# Patient Record
Sex: Female | Born: 1990 | Race: White | Hispanic: No | Marital: Single | Smoking: Never smoker
Health system: Southern US, Community
[De-identification: ages and names within clinical notes are randomized; demographics above are authoritative.]

## PROBLEM LIST (undated history)

## (undated) DIAGNOSIS — J45909 Unspecified asthma, uncomplicated: Secondary | ICD-10-CM

## (undated) DIAGNOSIS — G43909 Migraine, unspecified, not intractable, without status migrainosus: Secondary | ICD-10-CM

## (undated) HISTORY — PX: WISDOM TOOTH EXTRACTION: SHX21

## (undated) HISTORY — PX: TYMPANOSTOMY TUBE PLACEMENT: SHX32

## (undated) HISTORY — PX: INCISION AND DRAINAGE: SHX5863

---

## 2013-08-31 ENCOUNTER — Emergency Department (HOSPITAL_COMMUNITY)
Admission: EM | Admit: 2013-08-31 | Discharge: 2013-08-31 | Disposition: A | Discharge status: Home / Self Care | Payer: BC Managed Care – PPO | Attending: Emergency Medicine | Admitting: Emergency Medicine

## 2013-08-31 ENCOUNTER — Encounter (HOSPITAL_COMMUNITY): Payer: Self-pay | Admitting: Emergency Medicine

## 2013-08-31 DIAGNOSIS — Z8679 Personal history of other diseases of the circulatory system: Secondary | ICD-10-CM | POA: Insufficient documentation

## 2013-08-31 DIAGNOSIS — E860 Dehydration: Secondary | ICD-10-CM | POA: Insufficient documentation

## 2013-08-31 DIAGNOSIS — Z880 Allergy status to penicillin: Secondary | ICD-10-CM | POA: Insufficient documentation

## 2013-08-31 DIAGNOSIS — R000 Tachycardia, unspecified: Secondary | ICD-10-CM | POA: Insufficient documentation

## 2013-08-31 DIAGNOSIS — R630 Anorexia: Secondary | ICD-10-CM | POA: Insufficient documentation

## 2013-08-31 DIAGNOSIS — K5289 Other specified noninfective gastroenteritis and colitis: Secondary | ICD-10-CM | POA: Insufficient documentation

## 2013-08-31 DIAGNOSIS — Z79899 Other long term (current) drug therapy: Secondary | ICD-10-CM | POA: Insufficient documentation

## 2013-08-31 DIAGNOSIS — K529 Noninfective gastroenteritis and colitis, unspecified: Secondary | ICD-10-CM

## 2013-08-31 DIAGNOSIS — J45909 Unspecified asthma, uncomplicated: Secondary | ICD-10-CM | POA: Insufficient documentation

## 2013-08-31 DIAGNOSIS — R112 Nausea with vomiting, unspecified: Secondary | ICD-10-CM

## 2013-08-31 DIAGNOSIS — Z3202 Encounter for pregnancy test, result negative: Secondary | ICD-10-CM | POA: Insufficient documentation

## 2013-08-31 HISTORY — DX: Migraine, unspecified, not intractable, without status migrainosus: G43.909

## 2013-08-31 HISTORY — DX: Unspecified asthma, uncomplicated: J45.909

## 2013-08-31 LAB — CBC WITH DIFFERENTIAL/PLATELET
BASOS ABS: 0 10*3/uL (ref 0.0–0.1)
Basophils Relative: 0 % (ref 0–1)
EOS PCT: 0 % (ref 0–5)
Eosinophils Absolute: 0 10*3/uL (ref 0.0–0.7)
HCT: 50.1 % — ABNORMAL HIGH (ref 36.0–46.0)
Hemoglobin: 16.8 g/dL — ABNORMAL HIGH (ref 12.0–15.0)
Lymphocytes Relative: 7 % — ABNORMAL LOW (ref 12–46)
Lymphs Abs: 0.5 10*3/uL — ABNORMAL LOW (ref 0.7–4.0)
MCH: 29.3 pg (ref 26.0–34.0)
MCHC: 33.5 g/dL (ref 30.0–36.0)
MCV: 87.4 fL (ref 78.0–100.0)
Monocytes Absolute: 0.3 10*3/uL (ref 0.1–1.0)
Monocytes Relative: 4 % (ref 3–12)
Neutro Abs: 6.2 10*3/uL (ref 1.7–7.7)
Neutrophils Relative %: 89 % — ABNORMAL HIGH (ref 43–77)
PLATELETS: 327 10*3/uL (ref 150–400)
RBC: 5.73 MIL/uL — ABNORMAL HIGH (ref 3.87–5.11)
RDW: 12.6 % (ref 11.5–15.5)
WBC: 7 10*3/uL (ref 4.0–10.5)

## 2013-08-31 LAB — COMPREHENSIVE METABOLIC PANEL
ALT: 18 U/L (ref 0–35)
AST: 24 U/L (ref 0–37)
Albumin: 3.9 g/dL (ref 3.5–5.2)
Alkaline Phosphatase: 53 U/L (ref 39–117)
Anion gap: 18 — ABNORMAL HIGH (ref 5–15)
BILIRUBIN TOTAL: 0.2 mg/dL — AB (ref 0.3–1.2)
BUN: 11 mg/dL (ref 6–23)
CO2: 20 meq/L (ref 19–32)
CREATININE: 0.73 mg/dL (ref 0.50–1.10)
Calcium: 9.2 mg/dL (ref 8.4–10.5)
Chloride: 101 mEq/L (ref 96–112)
GFR calc Af Amer: >90 mL/min (ref >90)
Glucose, Bld: 110 mg/dL — ABNORMAL HIGH (ref 70–99)
Potassium: 4.1 mEq/L (ref 3.7–5.3)
Sodium: 139 mEq/L (ref 137–147)
Total Protein: 7.4 g/dL (ref 6.0–8.3)

## 2013-08-31 LAB — URINALYSIS, ROUTINE W REFLEX MICROSCOPIC
Glucose, UA: NEGATIVE mg/dL
Hgb urine dipstick: NEGATIVE
KETONES UR: NEGATIVE mg/dL
Leukocytes, UA: NEGATIVE
NITRITE: NEGATIVE
PROTEIN: NEGATIVE mg/dL
Specific Gravity, Urine: 1.036 — ABNORMAL HIGH (ref 1.005–1.030)
UROBILINOGEN UA: 0.2 mg/dL (ref 0.0–1.0)
pH: 5.5 (ref 5.0–8.0)

## 2013-08-31 LAB — POC URINE PREG, ED: PREG TEST UR: NEGATIVE

## 2013-08-31 MED ORDER — SODIUM CHLORIDE 0.9 % IV BOLUS (SEPSIS)
1000.0000 mL | Freq: Once | INTRAVENOUS | Status: AC
  Administered 2013-08-31: 1000 mL via INTRAVENOUS

## 2013-08-31 MED ORDER — ONDANSETRON 4 MG PO TBDP: 4.0000 mg | ORAL_TABLET | Freq: Three times a day (TID) | ORAL | Status: AC | PRN

## 2013-08-31 MED ORDER — MORPHINE SULFATE 4 MG/ML IJ SOLN
4.0000 mg | Freq: Once | INTRAMUSCULAR | Status: AC
  Administered 2013-08-31: 4 mg via INTRAVENOUS
  Filled 2013-08-31: qty 1

## 2013-08-31 MED ORDER — OXYCODONE-ACETAMINOPHEN 5-325 MG PO TABS: 1.0000 | ORAL_TABLET | Freq: Four times a day (QID) | ORAL | Status: AC | PRN

## 2013-08-31 MED ORDER — LOPERAMIDE HCL 2 MG PO CAPS
ORAL_CAPSULE | ORAL | Status: AC
Start: 2013-08-31 — End: ?

## 2013-08-31 MED ORDER — ONDANSETRON HCL 4 MG/2ML IJ SOLN
4.0000 mg | Freq: Once | INTRAMUSCULAR | Status: AC
  Administered 2013-08-31: 4 mg via INTRAVENOUS
  Filled 2013-08-31: qty 2

## 2013-08-31 NOTE — ED Notes (Signed)
Pt c/o abdominal pain with N/V/D x 3 days. Pt has not tried to eat anything today. Pt has not been around others with illness. Pt denies change in diet.

## 2013-08-31 NOTE — ED Provider Notes (Signed)
CSN: [id number]     Arrival date & time 08/31/13  1209 History   First MD Initiated Contact with Patient 08/31/13 1413     Chief Complaint  Patient presents with  . Abdominal Pain  . Emesis  . Diarrhea     (Consider location/radiation/quality/duration/timing/severity/associated sxs/prior Treatment) HPI Comments: Susan Huerta is a 23 y.o. Female with a PMHx of asthma presenting today with complaints of intermittent periumbilical abd pain and N/V x 2 days. She states that Friday night she had one episode of watery nonbloody diarrhea. Saturday she traveled to [place] to visit friends and had a decreased appetite, and had 3 more episodes of similar diarrhea. She says today she has had diarrhea every 30-13minutes, since nonbloody with no abnormal colors or features aside from being loose. Endorses that she developed nausea and vomited twice today, along with sharp/crampy 8/10 intermittent nonradiating abd pain, unrelieved by advil with no known aggravating or alleviating factors, which prompted her to come to the ED. Emesis was nonbilious, nonbloody. Abd pain was unchanged by BMs. States that her friend has had similar symptoms, and she was last with her 1 week ago. Denies any suspicious food intake, and says Friday she ate an omelet and green beans which are not out of the ordinary for her. Denies fevers, chills, HA, dizziness, lightheadedness, CP, SOB, hematochezia, melena, hematemesis, hematuria, dysuria, vaginal bleeding or discharge, recent sexual activity, back or flank pain, myalgias, arthralgias, or paresthesias. Continues to pass gas. LMP 08/17/13. 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  Patient is a 23 y.o. female presenting with abdominal pain, vomiting, and diarrhea. The  history is provided by the patient. No language interpreter was used.  Abdominal Pain Pain location:  Periumbilical and epigastric Pain quality: sharp   Pain radiates to:  Does not radiate Pain severity:  Moderate (8/10) Onset quality:  Sudden Duration:  2 days Timing:  Intermittent Progression:  Unchanged Chronicity:  New Context: sick contacts (friend has similar symptoms, last contact 1 wk ago)   Context: not alcohol use, not diet changes, not eating, not recent illness, not recent sexual activity, not recent travel and not suspicious food intake   Relieved by:  Nothing Worsened by:  Nothing tried Ineffective treatments:  NSAIDs Associated symptoms: anorexia (decreased appetite), diarrhea, nausea and vomiting   Associated symptoms: no belching, no chest pain, no chills, no constipation, no cough, no dysuria, no fever, no flatus, no hematemesis, no hematochezia, no hematuria, no melena, no shortness of breath, no sore throat, no vaginal bleeding and no vaginal discharge   Emesis Associated symptoms: abdominal pain and diarrhea   Associated symptoms: no arthralgias, no chills, no headaches, no myalgias and no sore throat   Diarrhea Associated symptoms: abdominal pain and vomiting   Associated symptoms: no arthralgias, no chills, no fever, no headaches and no myalgias     Past Medical History  Diagnosis Date  . Asthma   . Migraines    Past Surgical History  Procedure Laterality Date  . Incision and drainage    . Wisdom tooth extraction    . Tympanostomy tube placement     No family history on file. History  Substance Use Topics  . Smoking status: Never Smoker   .  Smokeless tobacco: Not on file  . Alcohol Use: Yes     Comment: rare   OB History   Grav Para Term Preterm Abortions TAB SAB Ect Mult Living                 Review of Systems  Constitutional: Negative for fever and chills.  HENT: Negative for sore throat.   Respiratory: Negative for cough and shortness of  breath.   Cardiovascular: Negative for chest pain.  Gastrointestinal: Positive for nausea, vomiting, abdominal pain, diarrhea and anorexia (decreased appetite). Negative for constipation, blood in stool, melena, hematochezia, abdominal distention, rectal pain, flatus and hematemesis.  Genitourinary: Negative for dysuria, urgency, frequency, hematuria, flank pain, vaginal bleeding, vaginal discharge, vaginal pain and pelvic pain.  Musculoskeletal: Negative for arthralgias and myalgias.  Skin: Negative for color change.  Neurological: Negative for dizziness, weakness, numbness and headaches.  Psychiatric/Behavioral: Negative for confusion.  10 Systems reviewed and are negative for acute change except as noted in the HPI.     Allergies  Amoxicillin and Penicillins  Home Medications   Prior to Admission medications   Medication Sig Start Date End Date Taking? Authorizing Provider  albuterol (PROVENTIL HFA;VENTOLIN HFA) 108 (90 BASE) MCG/ACT inhaler Inhale 1-2 puffs into the lungs every 6 (six) hours as needed for wheezing or shortness of breath.   Yes Historical Provider, MD  albuterol (PROVENTIL) (2.5 MG/3ML) 0.083% nebulizer solution Take 2.5 mg by nebulization every 4 (four) hours as needed for wheezing or shortness of breath.   Yes Historical Provider, MD  loperamide (IMODIUM) 2 MG capsule Take two tabs po initially, then one tab after each loose stool: max 8 tabs in 24 hours 08/31/13   Alta Shober Strupp Camprubi-Soms, PA-C  ondansetron (ZOFRAN ODT) 4 MG disintegrating tablet Take 1 tablet (4 mg total) by mouth every 8 (eight) hours as needed for nausea or vomiting. 08/31/13   Donnita Falls Camprubi-Soms, PA-C  oxyCODONE-acetaminophen (PERCOCET) 5-325 MG per tablet Take 1-2 tablets by mouth every 6 (six) hours as needed for severe pain. 08/31/13   Krew Hortman Strupp Camprubi-Soms, PA-C   BP 111/60  Pulse 82  Temp(Src) 98.7 F (37.1 C) (Oral)  Resp 14  Ht 5\' 5"  (1.651 m)  Wt 165 lb 4 oz  (74.957 kg)  BMI 27.50 kg/m2  SpO2 100%  LMP 08/17/2013 Physical Exam  Nursing note and vitals reviewed. Constitutional: She is oriented to person, place, and time. She appears well-developed and well-nourished.  Non-toxic appearance. No distress.  Afebrile. Mildly tachycardic at 101, BP in 110s/80s, appears uncomfortable but NAD and nontoxic  HENT:  Head: Normocephalic and atraumatic.  Mouth/Throat: Oropharynx is clear and moist. Mucous membranes are dry.  Mucous membranes dry, pt licking her lips which are slightly cracked  Eyes: Conjunctivae and EOM are normal. Pupils are equal, round, and reactive to light. Right eye exhibits no discharge. Left eye exhibits no discharge.  Neck: Normal range of motion. Neck supple.  Cardiovascular: Regular rhythm, normal heart sounds and intact distal pulses.  Tachycardia present.   No murmur heard. Mildly tachycardic to low 100s, BP 110s/80s. Regular rhythm, nl s1/s2, no m/r/g, distal pulses equal and intact  Pulmonary/Chest: Effort normal and breath sounds normal. She has no decreased breath sounds. She has no wheezes. She has no rales.  Abdominal: Soft. Normal appearance and bowel sounds are normal. She exhibits no distension. There is tenderness in the epigastric area and periumbilical area. There is no rigidity, no rebound, no guarding, no CVA tenderness, no  tenderness at McBurney's point and negative Murphy's sign.    +BS throughout with no high pitching tinkling, soft, nondistended, with mild epigastric TTP and periumbilical TTP but no lower abd TTP. Neg murphy's, neg McBurney's, neg psoas sign, neg foot tap test. No CVA TTP  Musculoskeletal: Normal range of motion.  Neurological: She is alert and oriented to person, place, and time.  Skin: Skin is warm, dry and intact. No rash noted.  Psychiatric: She has a normal mood and affect.    ED Course  Procedures (including critical care time) Labs Review Labs Reviewed  COMPREHENSIVE METABOLIC  PANEL - Abnormal; Notable for the following:    Glucose, Bld 110 (*)    Total Bilirubin 0.2 (*)    Anion gap 18 (*)    All other components within normal limits  CBC WITH DIFFERENTIAL - Abnormal; Notable for the following:    RBC 5.73 (*)    Hemoglobin 16.8 (*)    HCT 50.1 (*)    Neutrophils Relative % 89 (*)    Lymphocytes Relative 7 (*)    Lymphs Abs 0.5 (*)    All other components within normal limits  URINALYSIS, ROUTINE W REFLEX MICROSCOPIC - Abnormal; Notable for the following:    Specific Gravity, Urine 1.036 (*)    Bilirubin Urine SMALL (*)    All other components within normal limits  POC URINE PREG, ED    Imaging Review No results found.   EKG Interpretation None      MDM   Final diagnoses:  Gastroenteritis  Non-intractable vomiting with nausea, vomiting of unspecified type   Susan Huerta is a 23 y.o. female with a PMHx of asthma presenting with diarrhea, N/V and periumbilical abd pain ongoing since Friday night. Exam reveals mild epigastric/periumbilical TTP with no peritoneal signs, pt afebrile, appears dehydrated but nontoxic. DDx includes gastroenteritis, pancreatitis, early appendicitis, urinary infection. Doubt pelvic etiology given that there is no lower abd TTP, no vaginal symptoms. Doubt urinary/kidney source given that there's no urinary symptoms and no CVA TTP. Doubt gallbladder etiology given neg murphy's. Will obtain basic labs, Upreg, and U/A. Will give zofran morphine and fluids and reassess.  3:40 PM CBC with diff appears hemoconcentrated but no WBC count. CMP WNL, U/A reveals slight dehydration but no signs of infection. Upreg neg. Pt will get PO challenged and if she tolerates it will be stable to be d/c'd. Will reassess after PO challenge.  4:15 PM Pt reports improved abd pain and nausea, still having diarrhea but tolerating fluids well with no continued vomiting. Pt tachycardia resolved with fluids, BP normalized. Patient is afebrile and  nontoxic-appearing. Nonacute abdomen. Patient tolerated PO challenge in ER without any vomiting throughout ER stay. Symptoms consistent with gastroenteritis. Patient has no comorbidities and is appropriate for outpatient treatment gastroenteritis. Spoke at length with patient about  signs or symptoms that should prompt return to emergency Department. Pt voices understanding and is agreeable plan. Mother concerned with hx of intestinal issue at birth, but given that pt is still passing gas, appears nontoxic with no acute abd findings and is nondistended, discussed that imaging would not be appropriate at this time. Again, I explained the diagnosis and have given explicit precautions to return to the ER including for any other new or worsening symptoms. Rx for percocet, immodium, and zofran given with directions to avoid taking immodium if possible given that allowing body to rid itself of pathogens is wise, but pt wants it for comfort. Discussed using tylenol for  pain before trying percocet. Discussed staying well hydrated, following BRAT diet, and using zofran for nausea. The patient understands and accepts the medical plan as it's been dictated and I have answered their questions. Discharge instructions concerning home care and prescriptions have been given. The patient is STABLE and is discharged to home in good condition.  BP 111/60  Pulse 82  Temp(Src) 98.7 F (37.1 C) (Oral)  Resp 14  Ht 5\' 5"  (1.651 m)  Wt 165 lb 4 oz (74.957 kg)  BMI 27.50 kg/m2  SpO2 100%  LMP 08/17/2013     Donnita FallsMercedes Strupp Camprubi-Soms, PA-C 08/31/13 2359

## 2013-08-31 NOTE — Discharge Instructions (Signed)
Your belly pain is likely a viral gastroenteritis bug. Use zofran as prescribed, as needed for nausea. Use immodium as needed for diarrhea, but try to avoid using this if possible, and always use as directed. Use tylenol for pain, and use percocet for unrelieved pain.Stay well hydrated with small sips of fluids throughout the day. Follow a BRAT (banana-rice-applesauce-toast) diet as described below for the next 24-48 hours. The 'BRAT' diet is suggested, then progress to diet as tolerated as symptoms abate. Call if bloody stools, persistent diarrhea, vomiting, fever or abdominal pain. Return to ER for changing or worsening of symptoms.  Food Choices to Help Relieve Diarrhea When you have diarrhea, the foods you eat and your eating habits are very important. Choosing the right foods and drinks can help relieve diarrhea. Also, because diarrhea can last up to 7 days, you need to replace lost fluids and electrolytes (such as sodium, potassium, and chloride) in order to help prevent dehydration.  WHAT GENERAL GUIDELINES DO I NEED TO FOLLOW?  Slowly drink 1 cup (8 oz) of fluid for each episode of diarrhea. If you are getting enough fluid, your urine will be clear or pale yellow.  Eat starchy foods. Some good choices include white rice, white toast, pasta, low-fiber cereal, baked potatoes (without the skin), saltine crackers, and bagels.  Avoid large servings of any cooked vegetables.  Limit fruit to two servings per day. A serving is  cup or 1 small piece.  Choose foods with less than 2 g of fiber per serving.  Limit fats to less than 8 tsp (38 g) per day.  Avoid fried foods.  Eat foods that have probiotics in them. Probiotics can be found in certain dairy products.  Avoid foods and beverages that may increase the speed at which food moves through the stomach and intestines (gastrointestinal tract). Things to avoid include:  High-fiber foods, such as dried fruit, raw fruits and vegetables, nuts,  seeds, and whole grain foods.  Spicy foods and high-fat foods.  Foods and beverages sweetened with high-fructose corn syrup, honey, or sugar alcohols such as xylitol, sorbitol, and mannitol. WHAT FOODS ARE RECOMMENDED? Grains White rice. White, [place], or pita breads (fresh or toasted), including plain rolls, buns, or bagels. White pasta. Saltine, soda, or graham crackers. Pretzels. Low-fiber cereal. Cooked cereals made with water (such as cornmeal, farina, or cream cereals). Plain muffins. Matzo. Melba toast. Zwieback.  Vegetables Potatoes (without the skin). Strained tomato and vegetable juices. Most well-cooked and canned vegetables without seeds. Tender lettuce. Fruits Cooked or canned applesauce, apricots, cherries, fruit cocktail, grapefruit, peaches, pears, or plums. Fresh bananas, apples without skin, cherries, grapes, cantaloupe, grapefruit, peaches, oranges, or plums.  Meat and Other Protein Products Baked or boiled chicken. Eggs. Tofu. Fish. Seafood. Smooth peanut butter. Ground or well-cooked tender beef, ham, veal, lamb, pork, or poultry.  Dairy Plain yogurt, kefir, and unsweetened liquid yogurt. Lactose-free milk, buttermilk, or soy milk. Plain hard cheese. Beverages Sport drinks. Clear broths. Diluted fruit juices (except prune). Regular, caffeine-free sodas such as ginger ale. Water. Decaffeinated teas. Oral rehydration solutions. Sugar-free beverages not sweetened with sugar alcohols. Other Bouillon, broth, or soups made from recommended foods.  The items listed above may not be a complete list of recommended foods or beverages. Contact your dietitian for more options. WHAT FOODS ARE NOT RECOMMENDED? Grains Whole grain, whole wheat, bran, or rye breads, rolls, pastas, crackers, and cereals. Wild or brown rice. Cereals that contain more than 2 g of fiber per serving. Corn  tortillas or taco shells. Cooked or dry oatmeal. Granola. Popcorn. Vegetables Raw vegetables. Cabbage,  broccoli, Brussels sprouts, artichokes, baked beans, beet greens, corn, kale, legumes, peas, sweet potatoes, and yams. Potato skins. Cooked spinach and cabbage. Fruits Dried fruit, including raisins and dates. Raw fruits. Stewed or dried prunes. Fresh apples with skin, apricots, mangoes, pears, raspberries, and strawberries.  Meat and Other Protein Products Chunky peanut butter. Nuts and seeds. Beans and lentils. Tomasa Blase.  Dairy High-fat cheeses. Milk, chocolate milk, and beverages made with milk, such as milk shakes. Cream. Ice cream. Sweets and Desserts Sweet rolls, doughnuts, and sweet breads. Pancakes and waffles. Fats and Oils Butter. Cream sauces. Margarine. Salad oils. Plain salad dressings. Olives. Avocados.  Beverages Caffeinated beverages (such as coffee, tea, soda, or energy drinks). Alcoholic beverages. Fruit juices with pulp. Prune juice. Soft drinks sweetened with high-fructose corn syrup or sugar alcohols. Other Coconut. Hot sauce. Chili powder. Mayonnaise. Gravy. Cream-based or milk-based soups.  The items listed above may not be a complete list of foods and beverages to avoid. Contact your dietitian for more information. WHAT SHOULD I DO IF I BECOME DEHYDRATED? Diarrhea can sometimes lead to dehydration. Signs of dehydration include dark urine and dry mouth and skin. If you think you are dehydrated, you should rehydrate with an oral rehydration solution. These solutions can be purchased at pharmacies, retail stores, or online.  Drink -1 cup (120-240 mL) of oral rehydration solution each time you have an episode of diarrhea. If drinking this amount makes your diarrhea worse, try drinking smaller amounts more often. For example, drink 1-3 tsp (5-15 mL) every 5-10 minutes.  A general rule for staying hydrated is to drink 1-2 L of fluid per day. Talk to your health care provider about the specific amount you should be drinking each day. Drink enough fluids to keep your urine clear or  pale yellow. Document Released: 04/22/2003 Document Revised: 02/04/2013 Document Reviewed: 12/23/2012 [facility] Patient Information 2015 [place], [place]. This information is not intended to replace advice given to you by your health care provider. Make sure you discuss any questions you have with your health care provider.    Abdominal (belly) pain can be caused by many things. Your caregiver performed an examination and possibly ordered blood/urine tests and imaging (CT scan, x-rays, ultrasound). Many cases can be observed and treated at home after initial evaluation in the emergency department. Even though you are being discharged home, abdominal pain can be unpredictable. Therefore, you need a repeated exam if your pain does not resolve, returns, or worsens. Most patients with abdominal pain don't have to be admitted to the hospital or have surgery, but serious problems like appendicitis and gallbladder attacks can start out as nonspecific pain. Many abdominal conditions cannot be diagnosed in one visit, so follow-up evaluations are very important. SEEK IMMEDIATE MEDICAL ATTENTION IF YOU DEVELOP ANY OF THE FOLLOWING SYMPTOMS:  The pain does not go away or becomes severe.   A temperature above 101 develops.   Repeated vomiting occurs (multiple episodes).   The pain becomes localized to portions of the abdomen. The right side could possibly be appendicitis. In an adult, the left lower portion of the abdomen could be colitis or diverticulitis.   Blood is being passed in stools or vomit (bright red or black tarry stools).   Return also if you develop chest pain, difficulty breathing, dizziness or fainting, or become confused, poorly responsive, or inconsolable (young children).  The constipation stays for more than 4 days.  There is belly (abdominal) or rectal pain.   You do not seem to be getting better.    Viral Gastroenteritis Viral gastroenteritis is also called stomach flu. This  illness is caused by a certain type of germ (virus). It can cause sudden watery poop (diarrhea) and throwing up (vomiting). This can cause you to lose body fluids (dehydration). This illness usually lasts for 3 to 8 days. It usually goes away on its own. HOME CARE   Drink enough fluids to keep your pee (urine) clear or pale yellow. Drink small amounts of fluids often.  Ask your doctor how to replace body fluid losses (rehydration).  Avoid:  Foods high in sugar.  Alcohol.  Bubbly (carbonated) drinks.  Tobacco.  Juice.  Caffeine drinks.  Very hot or cold fluids.  Fatty, greasy foods.  Eating too much at one time.  Dairy products until 24 to 48 hours after your watery poop stops.  You may eat foods with active cultures (probiotics). They can be found in some yogurts and supplements.  Wash your hands well to avoid spreading the illness.  Only take medicines as told by your doctor. Do not give aspirin to children. Do not take medicines for watery poop (antidiarrheals).  Ask your doctor if you should keep taking your regular medicines.  Keep all doctor visits as told. GET HELP RIGHT AWAY IF:   You cannot keep fluids down.  You do not pee at least once every 6 to 8 hours.  You are short of breath.  You see blood in your poop or throw up. This may look like coffee grounds.  You have belly (abdominal) pain that gets worse or is just in one small spot (localized).  You keep throwing up or having watery poop.  You have a fever.  The patient is a child younger than 3 months, and he or she has a fever.  The patient is a child older than 3 months, and he or she has a fever and problems that do not go away.  The patient is a child older than 3 months, and he or she has a fever and problems that suddenly get worse.  The patient is a baby, and he or she has no tears when crying. MAKE SURE YOU:   Understand these instructions.  Will watch your condition.  Will get  help right away if you are not doing well or get worse. Document Released: 07/19/2007 Document Revised: 04/24/2011 Document Reviewed: 11/16/2010 [facility] Patient Information 2015 [place], [place]. This information is not intended to replace advice given to you by your health care provider. Make sure you discuss any questions you have with your health care provider.

## 2013-09-01 ENCOUNTER — Emergency Department (HOSPITAL_BASED_OUTPATIENT_CLINIC_OR_DEPARTMENT_OTHER)
Admission: EM | Admit: 2013-09-01 | Discharge: 2013-09-02 | Disposition: A | Discharge status: Home / Self Care | Payer: BC Managed Care – PPO | Attending: Emergency Medicine | Admitting: Emergency Medicine

## 2013-09-01 ENCOUNTER — Emergency Department (HOSPITAL_BASED_OUTPATIENT_CLINIC_OR_DEPARTMENT_OTHER): Payer: BC Managed Care – PPO

## 2013-09-01 ENCOUNTER — Encounter (HOSPITAL_BASED_OUTPATIENT_CLINIC_OR_DEPARTMENT_OTHER): Payer: Self-pay | Admitting: Emergency Medicine

## 2013-09-01 DIAGNOSIS — R1084 Generalized abdominal pain: Secondary | ICD-10-CM | POA: Insufficient documentation

## 2013-09-01 DIAGNOSIS — Z8679 Personal history of other diseases of the circulatory system: Secondary | ICD-10-CM | POA: Insufficient documentation

## 2013-09-01 DIAGNOSIS — R7989 Other specified abnormal findings of blood chemistry: Secondary | ICD-10-CM | POA: Insufficient documentation

## 2013-09-01 DIAGNOSIS — R945 Abnormal results of liver function studies: Secondary | ICD-10-CM

## 2013-09-01 DIAGNOSIS — J45909 Unspecified asthma, uncomplicated: Secondary | ICD-10-CM | POA: Insufficient documentation

## 2013-09-01 DIAGNOSIS — Z3202 Encounter for pregnancy test, result negative: Secondary | ICD-10-CM | POA: Insufficient documentation

## 2013-09-01 DIAGNOSIS — Z79899 Other long term (current) drug therapy: Secondary | ICD-10-CM | POA: Insufficient documentation

## 2013-09-01 DIAGNOSIS — Z880 Allergy status to penicillin: Secondary | ICD-10-CM | POA: Insufficient documentation

## 2013-09-01 LAB — CBC WITH DIFFERENTIAL/PLATELET
BASOS ABS: 0 10*3/uL (ref 0.0–0.1)
Basophils Relative: 0 % (ref 0–1)
EOS ABS: 0.1 10*3/uL (ref 0.0–0.7)
Eosinophils Relative: 2 % (ref 0–5)
HCT: 46.4 % — ABNORMAL HIGH (ref 36.0–46.0)
Hemoglobin: 15.9 g/dL — ABNORMAL HIGH (ref 12.0–15.0)
LYMPHS ABS: 1.2 10*3/uL (ref 0.7–4.0)
Lymphocytes Relative: 25 % (ref 12–46)
MCH: 29.3 pg (ref 26.0–34.0)
MCHC: 34.3 g/dL (ref 30.0–36.0)
MCV: 85.6 fL (ref 78.0–100.0)
Monocytes Absolute: 0.4 10*3/uL (ref 0.1–1.0)
Monocytes Relative: 9 % (ref 3–12)
Neutro Abs: 2.9 10*3/uL (ref 1.7–7.7)
Neutrophils Relative %: 64 % (ref 43–77)
PLATELETS: 257 10*3/uL (ref 150–400)
RBC: 5.42 MIL/uL — AB (ref 3.87–5.11)
RDW: 12.5 % (ref 11.5–15.5)
WBC: 4.6 10*3/uL (ref 4.0–10.5)

## 2013-09-01 LAB — COMPREHENSIVE METABOLIC PANEL
ALT: 68 U/L — AB (ref 0–35)
AST: 53 U/L — ABNORMAL HIGH (ref 0–37)
Albumin: 3.6 g/dL (ref 3.5–5.2)
Alkaline Phosphatase: 52 U/L (ref 39–117)
Anion gap: 13 (ref 5–15)
BUN: 7 mg/dL (ref 6–23)
CO2: 23 meq/L (ref 19–32)
Calcium: 9.2 mg/dL (ref 8.4–10.5)
Chloride: 103 mEq/L (ref 96–112)
Creatinine, Ser: 0.8 mg/dL (ref 0.50–1.10)
GFR calc non Af Amer: >90 mL/min (ref >90)
Glucose, Bld: 91 mg/dL (ref 70–99)
Potassium: 3.9 mEq/L (ref 3.7–5.3)
SODIUM: 139 meq/L (ref 137–147)
TOTAL PROTEIN: 6.9 g/dL (ref 6.0–8.3)
Total Bilirubin: 0.3 mg/dL (ref 0.3–1.2)

## 2013-09-01 LAB — URINE MICROSCOPIC-ADD ON

## 2013-09-01 LAB — URINALYSIS, ROUTINE W REFLEX MICROSCOPIC
Bilirubin Urine: NEGATIVE
Glucose, UA: NEGATIVE mg/dL
KETONES UR: NEGATIVE mg/dL
Leukocytes, UA: NEGATIVE
Nitrite: NEGATIVE
PROTEIN: NEGATIVE mg/dL
Specific Gravity, Urine: 1.017 (ref 1.005–1.030)
UROBILINOGEN UA: 1 mg/dL (ref 0.0–1.0)
pH: 6 (ref 5.0–8.0)

## 2013-09-01 LAB — PREGNANCY, URINE: Preg Test, Ur: NEGATIVE

## 2013-09-01 LAB — LIPASE, BLOOD: Lipase: 32 U/L (ref 11–59)

## 2013-09-01 MED ORDER — MORPHINE SULFATE 4 MG/ML IJ SOLN
4.0000 mg | Freq: Once | INTRAMUSCULAR | Status: AC
  Administered 2013-09-01: 4 mg via INTRAVENOUS
  Filled 2013-09-01: qty 1

## 2013-09-01 MED ORDER — DICYCLOMINE HCL 20 MG PO TABS: 20.0000 mg | ORAL_TABLET | Freq: Three times a day (TID) | ORAL | Status: AC

## 2013-09-01 MED ORDER — DICYCLOMINE HCL 10 MG PO CAPS: 10.0000 mg | ORAL_CAPSULE | Freq: Three times a day (TID) | ORAL | Status: DC

## 2013-09-01 MED ORDER — SODIUM CHLORIDE 0.9 % IV BOLUS (SEPSIS)
1000.0000 mL | Freq: Once | INTRAVENOUS | Status: AC
  Administered 2013-09-01: 1000 mL via INTRAVENOUS

## 2013-09-01 NOTE — ED Notes (Signed)
Vomiting and diarrhea. [facility] yesterday. No better.

## 2013-09-01 NOTE — Discharge Instructions (Signed)
Read the information below.  Use the prescribed medication as directed.  Please discuss all new medications with your pharmacist.  You may return to the Emergency Department at any time for worsening condition or any new symptoms that concern you.  If you develop high fevers, worsening abdominal pain, uncontrolled vomiting, or are unable to tolerate fluids by mouth, return to the ER for a recheck.   ° °Abdominal Pain °Many things can cause abdominal pain. Usually, abdominal pain is not caused by a disease and will improve without treatment. It can often be observed and treated at home. Your health care provider will do a physical exam and possibly order blood tests and X-rays to help determine the seriousness of your pain. However, in many cases, more time must pass before a clear cause of the pain can be found. Before that point, your health care provider may not know if you need more testing or further treatment. °HOME CARE INSTRUCTIONS  °Monitor your abdominal pain for any changes. The following actions may help to alleviate any discomfort you are experiencing: °· Only take over-the-counter or prescription medicines as directed by your health care provider. °· Do not take laxatives unless directed to do so by your health care provider. °· Try a clear liquid diet (broth, tea, or water) as directed by your health care provider. Slowly move to a bland diet as tolerated. °SEEK MEDICAL CARE IF: °· You have unexplained abdominal pain. °· You have abdominal pain associated with nausea or diarrhea. °· You have pain when you urinate or have a bowel movement. °· You experience abdominal pain that wakes you in the night. °· You have abdominal pain that is worsened or improved by eating food. °· You have abdominal pain that is worsened with eating fatty foods. °· You have a fever. °SEEK IMMEDIATE MEDICAL CARE IF:  °· Your pain does not go away within 2 hours. °· You keep throwing up (vomiting). °· Your pain is felt only in  portions of the abdomen, such as the right side or the left lower portion of the abdomen. °· You pass bloody or black tarry stools. °MAKE SURE YOU: °· Understand these instructions.   °· Will watch your condition.   °· Will get help right away if you are not doing well or get worse.   °Document Released: 11/09/2004 Document Revised: 02/04/2013 Document Reviewed: 10/09/2012 °[employer]® Patient Information ©2015 [employer]. This information is not intended to replace advice given to you by your health care provider. Make sure you discuss any questions you have with your health care provider. ° °

## 2013-09-01 NOTE — ED Provider Notes (Signed)
Medical screening examination/treatment/procedure(s) were performed by non-physician practitioner and as supervising physician I was immediately available for consultation/collaboration.   EKG Interpretation None        Layla MawKristen N Madisson Kulaga, DO 09/01/13 0006

## 2013-09-01 NOTE — ED Notes (Signed)
Patient transported to X-ray 

## 2013-09-01 NOTE — ED Provider Notes (Signed)
CSN: [id number]     Arrival date & time 09/01/13  1954 History   First MD Initiated Contact with Patient 09/01/13 2040     Chief Complaint  Patient presents with  . Abdominal Pain     (Consider location/radiation/quality/duration/timing/severity/associated sxs/prior Treatment) HPI  Pt seen in ED yesterday for N/V/D presents today for continued abdominal pain and new abdominal bloating.  Reports diarrhea that began four days ago, which increased over two days.  She then developed N/V.  Had anorexia throughout the course of the illness.  Was having diarrhea every 30-60 minutes.  Yesterday was seen in ED, given pain and nausea medication, d/c home.  States last night she took imodium, has taken two doses total.  Today around midday she notes her abdomen appeared swollen.  Has constant periumbilical pain, sharp in nature, worse with movement.  Has only had a little stool today, is mostly passing gas.  Denies fevers, N/V today, melena or hematochezia.  Denies hx abdominal surgery, notes that as a child she had a "loop that goes up and down" that was called a "blockage," but resolved without surgery.  Unknown diagnosis.   Has friend who had similar symptoms but was told by her MD it was a reaction to an antibiotic she was taking.  Denies any abnormal foods, recent travel.   Denies urinary or vaginal complaints.   Past Medical History  Diagnosis Date  . Asthma   . Migraines    Past Surgical History  Procedure Laterality Date  . Incision and drainage    . Wisdom tooth extraction    . Tympanostomy tube placement     No family history on file. History  Substance Use Topics  . Smoking status: Never Smoker   . Smokeless tobacco: Not on file  . Alcohol Use: Yes     Comment: rare   OB History   Grav Para Term Preterm Abortions TAB SAB Ect Mult Living                 Review of Systems  All other systems reviewed and are negative.     Allergies  Amoxicillin and Penicillins  Home  Medications   Prior to Admission medications   Medication Sig Start Date End Date Taking? Authorizing Provider  albuterol (PROVENTIL HFA;VENTOLIN HFA) 108 (90 BASE) MCG/ACT inhaler Inhale 1-2 puffs into the lungs every 6 (six) hours as needed for wheezing or shortness of breath.    Historical Provider, MD  albuterol (PROVENTIL) (2.5 MG/3ML) 0.083% nebulizer solution Take 2.5 mg by nebulization every 4 (four) hours as needed for wheezing or shortness of breath.    Historical Provider, MD  loperamide (IMODIUM) 2 MG capsule Take two tabs po initially, then one tab after each loose stool: max 8 tabs in 24 hours 08/31/13   Mercedes Strupp Camprubi-Soms, PA-C  ondansetron (ZOFRAN ODT) 4 MG disintegrating tablet Take 1 tablet (4 mg total) by mouth every 8 (eight) hours as needed for nausea or vomiting. 08/31/13   Donnita Falls Camprubi-Soms, PA-C  oxyCODONE-acetaminophen (PERCOCET) 5-325 MG per tablet Take 1-2 tablets by mouth every 6 (six) hours as needed for severe pain. 08/31/13   Mercedes Strupp Camprubi-Soms, PA-C   BP 143/96  Pulse 92  Temp(Src) 98.5 F (36.9 C) (Oral)  Resp 20  Wt 165 lb (74.844 kg)  SpO2 99%  LMP 08/17/2013 Physical Exam  Nursing note and vitals reviewed. Constitutional: She appears well-developed and well-nourished. No distress.  HENT:  Head: Normocephalic and atraumatic.  Neck: Neck supple.  Cardiovascular: Normal rate and regular rhythm.   Pulmonary/Chest: Effort normal and breath sounds normal. No respiratory distress. She has no wheezes. She has no rales.  Abdominal: Soft. Bowel sounds are normal. She exhibits no distension and no mass. There is tenderness. There is no rebound and no guarding.  Neurological: She is alert.  Skin: She is not diaphoretic.    ED Course  Procedures (including critical care time) Labs Review Labs Reviewed  URINALYSIS, ROUTINE W REFLEX MICROSCOPIC - Abnormal; Notable for the following:    APPearance CLOUDY (*)    Hgb urine dipstick  TRACE (*)    All other components within normal limits  URINE MICROSCOPIC-ADD ON - Abnormal; Notable for the following:    Squamous Epithelial / LPF MANY (*)    Bacteria, UA MANY (*)    Crystals CA OXALATE CRYSTALS (*)    All other components within normal limits  CBC WITH DIFFERENTIAL - Abnormal; Notable for the following:    RBC 5.42 (*)    Hemoglobin 15.9 (*)    HCT 46.4 (*)    All other components within normal limits  COMPREHENSIVE METABOLIC PANEL - Abnormal; Notable for the following:    AST 53 (*)    ALT 68 (*)    All other components within normal limits  PREGNANCY, URINE  LIPASE, BLOOD    Imaging Review [place] Abdomen Complete  09/01/2013   CLINICAL DATA:  Abdominal pain and bloating. Diarrhea. Elevated LFTs.  EXAM: ULTRASOUND ABDOMEN COMPLETE  COMPARISON:  None.  FINDINGS: Gallbladder:  No gallstones or wall thickening visualized. There is question of mild echogenic sludge dependently within the gallbladder. No sonographic Murphy sign noted.  Common bile duct:  Diameter: 0.2 cm, within normal limits in caliber.  Liver:  No focal lesion identified. Areas of increased parenchymal echogenicity raise question for areas of fatty infiltration within the liver.  IVC:  No abnormality visualized.  Pancreas:  Visualized portion unremarkable.  Spleen:  Size and appearance within normal limits.  Right Kidney:  Length: 10.7 cm. Echogenicity within normal limits. No mass or hydronephrosis visualized.  Left Kidney:  Length: 10.8 cm. Echogenicity within normal limits. No mass or hydronephrosis visualized.  Abdominal aorta:  No aneurysm visualized. Not visualized distally due to overlying bowel gas.  Other findings:  None.  IMPRESSION: 1. No acute abnormality seen within the abdomen. 2. Question of mild echogenic sludge dependently within the gallbladder. Gallbladder otherwise unremarkable in appearance. 3. Suggestion of areas of fatty infiltration within the liver.   Electronically Signed   By: Roanna RaiderJeffery   Chang M.D.   On: 09/01/2013 23:07   Dg Abd Acute W/chest  09/01/2013   CLINICAL DATA:  Vomiting and diarrhea.  EXAM: ACUTE ABDOMEN SERIES (ABDOMEN 2 VIEW & CHEST 1 VIEW)  COMPARISON:  None.  FINDINGS: The heart size and mediastinal contours are normal. The lungs are clear. There is no pleural effusion or pneumothorax. No acute osseous findings are identified.  There is prominent gas throughout the colon without significant associated distension or wall thickening. There is no pneumatosis. No significant small bowel distention is present. There are scattered air-fluid levels on the erect examination, primarily in the colon. No free intraperitoneal air or suspicious calcification identified. The osseous structures appear normal.  IMPRESSION: Prominent gas throughout the colon with scattered air-fluid levels, nonspecific and likely related to diarrhea. No evidence of bowel obstruction or free air.   Electronically Signed   By: Roxy HorsemanBill  Veazey M.D.   On:  09/01/2013 21:30     EKG Interpretation None      Reexamination of abdomen prior to discharge was completely unchanged: soft, nondistended, diffusely tender to palpation, no guarding, no rebound.  No focal tenderness.    MDM   Final diagnoses:  Generalized abdominal pain    Afebrile nontoxic patient with diffuse abdominal pain, N/V/D that is resolving.  Pt now with increased abdominal distension and flatulence after taking imodium and narcotics (morphine in ED, percocet x 1 at home).  Labs show increase in LFTs, slight improvement in elevated Hgb, likely from dehydration    Xray shows gas, no e/o bowel obstruction.  [place] ordered given increase in LFTs, only small amount gallbladder sludge and possible fatty infiltration of liver.  Discussed  All results with patient and her brother (with her consent).  Pt advised to follow up with PCP regarding elevated LFTs and possible fatty liver.  UA appears contaminated, pt not having urinary symptoms.  Discussed  result, findings, treatment, and follow up  with patient.  Pt given return precautions.  Pt verbalizes understanding and agrees with plan.        Trixie Dredge, PA-C 09/02/13 0027  Trixie Dredge, PA-C 09/02/13 0028

## 2013-09-04 NOTE — ED Provider Notes (Signed)
Medical screening examination/treatment/procedure(s) were performed by non-physician practitioner and as supervising physician I was immediately available for consultation/collaboration.   EKG Interpretation None       Martha K Linker, MD 09/04/13 1606 

## 2014-01-25 ENCOUNTER — Emergency Department (HOSPITAL_BASED_OUTPATIENT_CLINIC_OR_DEPARTMENT_OTHER)
Admission: EM | Admit: 2014-01-25 | Discharge: 2014-01-25 | Disposition: A | Discharge status: Home / Self Care | Payer: BC Managed Care – PPO | Attending: Emergency Medicine | Admitting: Emergency Medicine

## 2014-01-25 ENCOUNTER — Encounter (HOSPITAL_BASED_OUTPATIENT_CLINIC_OR_DEPARTMENT_OTHER): Payer: Self-pay | Admitting: *Deleted

## 2014-01-25 DIAGNOSIS — Z880 Allergy status to penicillin: Secondary | ICD-10-CM | POA: Diagnosis not present

## 2014-01-25 DIAGNOSIS — Z8669 Personal history of other diseases of the nervous system and sense organs: Secondary | ICD-10-CM | POA: Diagnosis not present

## 2014-01-25 DIAGNOSIS — R21 Rash and other nonspecific skin eruption: Secondary | ICD-10-CM | POA: Diagnosis present

## 2014-01-25 DIAGNOSIS — L03116 Cellulitis of left lower limb: Secondary | ICD-10-CM | POA: Diagnosis not present

## 2014-01-25 DIAGNOSIS — Z79899 Other long term (current) drug therapy: Secondary | ICD-10-CM | POA: Diagnosis not present

## 2014-01-25 DIAGNOSIS — J45909 Unspecified asthma, uncomplicated: Secondary | ICD-10-CM | POA: Insufficient documentation

## 2014-01-25 DIAGNOSIS — Z3202 Encounter for pregnancy test, result negative: Secondary | ICD-10-CM | POA: Insufficient documentation

## 2014-01-25 LAB — BASIC METABOLIC PANEL
Anion gap: 12 (ref 5–15)
BUN: 13 mg/dL (ref 6–23)
CALCIUM: 9.4 mg/dL (ref 8.4–10.5)
CO2: 27 mEq/L (ref 19–32)
Chloride: 102 mEq/L (ref 96–112)
Creatinine, Ser: 0.9 mg/dL (ref 0.50–1.10)
GFR calc non Af Amer: >90 mL/min (ref >90)
Glucose, Bld: 88 mg/dL (ref 70–99)
Potassium: 4 mEq/L (ref 3.7–5.3)
Sodium: 141 mEq/L (ref 137–147)

## 2014-01-25 LAB — CBC WITH DIFFERENTIAL/PLATELET
BASOS PCT: 1 % (ref 0–1)
Basophils Absolute: 0 10*3/uL (ref 0.0–0.1)
EOS ABS: 1 10*3/uL — AB (ref 0.0–0.7)
Eosinophils Relative: 14 % — ABNORMAL HIGH (ref 0–5)
HEMATOCRIT: 46.1 % — AB (ref 36.0–46.0)
Hemoglobin: 15.4 g/dL — ABNORMAL HIGH (ref 12.0–15.0)
Lymphocytes Relative: 33 % (ref 12–46)
Lymphs Abs: 2.3 10*3/uL (ref 0.7–4.0)
MCH: 29.6 pg (ref 26.0–34.0)
MCHC: 33.4 g/dL (ref 30.0–36.0)
MCV: 88.5 fL (ref 78.0–100.0)
MONO ABS: 0.5 10*3/uL (ref 0.1–1.0)
Monocytes Relative: 7 % (ref 3–12)
NEUTROS PCT: 45 % (ref 43–77)
Neutro Abs: 3.1 10*3/uL (ref 1.7–7.7)
Platelets: 366 10*3/uL (ref 150–400)
RBC: 5.21 MIL/uL — ABNORMAL HIGH (ref 3.87–5.11)
RDW: 12.6 % (ref 11.5–15.5)
WBC: 6.8 10*3/uL (ref 4.0–10.5)

## 2014-01-25 LAB — PREGNANCY, URINE: Preg Test, Ur: NEGATIVE

## 2014-01-25 MED ORDER — FLUCONAZOLE 150 MG PO TABS: 150.0000 mg | ORAL_TABLET | ORAL | Status: AC

## 2014-01-25 MED ORDER — SULFAMETHOXAZOLE-TRIMETHOPRIM 800-160 MG PO TABS: 1.0000 | ORAL_TABLET | Freq: Two times a day (BID) | ORAL | Status: AC

## 2014-01-25 MED ORDER — CLOTRIMAZOLE 1 % EX CREA: TOPICAL_CREAM | CUTANEOUS | Status: AC

## 2014-01-25 MED ORDER — CEPHALEXIN 500 MG PO CAPS: 500.0000 mg | ORAL_CAPSULE | Freq: Four times a day (QID) | ORAL | Status: AC

## 2014-01-25 NOTE — ED Provider Notes (Signed)
CSN: [id number]     Arrival date & time 01/25/14  1528 History  This chart was scribed for Tilden FossaElizabeth Aryanna Shaver, MD by Gwenyth Oberatherine Macek, ED Scribe. This patient was seen in room MH05/MH05 and the patient's care was started at 3:42 PM.     Chief Complaint  Patient presents with  . Recurrent Skin Infections   The history is provided by the patient. No language interpreter was used.    HPI Comments: Susan Huerta is a 23 y.o. female with a history of cellulitis but no chronic medical conditions who presents to the Emergency Department complaining of a recurrent skin infection that started 1 month ago and became worse 1 day ago. She notes feeling warm yesterday as an associated symptom. Pt states that she had a fungal infection and swelling on her thighs last month that was treated with Nystatin cream [facility]. She notes that symptoms improved, became worse again 2 weeks later and were retreated with doxycycline, which she finished 2 days ago, and nystatin. Pt reports that rash is still on medial thighs, but has spread to her arms and hands. She notes that symptoms became worse after shaving and running. Pt was advised to follow up with a specialist, but she could not schedule an appointment until January. She denies the possibility of pregnancy because she had a negative test a few weeks ago. She is allergic to penicillin. Pt denies fever, vomiting, diarrhea, abdominal pain and shortness of breath as associated symtpoms.   Past Medical History  Diagnosis Date  . Asthma   . Migraines    Past Surgical History  Procedure Laterality Date  . Incision and drainage    . Wisdom tooth extraction    . Tympanostomy tube placement     No family history on file. History  Substance Use Topics  . Smoking status: Never Smoker   . Smokeless tobacco: Not on file  . Alcohol Use: Yes     Comment: rare   OB History    No data available     Review of Systems  Constitutional: Negative for fever.   Respiratory: Negative for shortness of breath.   Gastrointestinal: Negative for nausea, vomiting, abdominal pain and diarrhea.  Skin: Positive for rash.  All other systems reviewed and are negative.  Allergies  Amoxicillin and Penicillins  Home Medications   Prior to Admission medications   Medication Sig Start Date End Date Taking? Authorizing Provider  albuterol (PROVENTIL HFA;VENTOLIN HFA) 108 (90 BASE) MCG/ACT inhaler Inhale 1-2 puffs into the lungs every 6 (six) hours as needed for wheezing or shortness of breath.    Historical Provider, MD  albuterol (PROVENTIL) (2.5 MG/3ML) 0.083% nebulizer solution Take 2.5 mg by nebulization every 4 (four) hours as needed for wheezing or shortness of breath.    Historical Provider, MD  dicyclomine (BENTYL) 20 MG tablet Take 1 tablet (20 mg total) by mouth 4 (four) times daily -  before meals and at bedtime. 09/01/13   Trixie DredgeEmily West, PA-C  loperamide (IMODIUM) 2 MG capsule Take two tabs po initially, then one tab after each loose stool: max 8 tabs in 24 hours 08/31/13   Mercedes Strupp Camprubi-Soms, PA-C  ondansetron (ZOFRAN ODT) 4 MG disintegrating tablet Take 1 tablet (4 mg total) by mouth every 8 (eight) hours as needed for nausea or vomiting. 08/31/13   Donnita FallsMercedes Strupp Camprubi-Soms, PA-C  oxyCODONE-acetaminophen (PERCOCET) 5-325 MG per tablet Take 1-2 tablets by mouth every 6 (six) hours as needed for severe  pain. 08/31/13   Donnita FallsMercedes Strupp Camprubi-Soms, PA-C   There were no vitals taken for this visit. Physical Exam  Constitutional: She is oriented to person, place, and time. She appears well-developed and well-nourished.  HENT:  Head: Normocephalic and atraumatic.  Cardiovascular: Normal rate and regular rhythm.   No murmur heard. Pulmonary/Chest: Effort normal and breath sounds normal. No respiratory distress.  Abdominal: Soft. There is no tenderness. There is no rebound and no guarding.  Musculoskeletal: She exhibits no edema or tenderness.   Neurological: She is alert and oriented to person, place, and time.  Skin: Skin is warm and dry.  Erythematous scaling patches on bilateral arms.  Bilateral thighs with warm, slightly indurated erythematous plaques with scaling on medial aspect.  No abscess, no vesicles.  No weeping/crusting.    Psychiatric: She has a normal mood and affect. Her behavior is normal.  Nursing note and vitals reviewed.   ED Course  Procedures (including critical care time) DIAGNOSTIC STUDIES: Oxygen Saturation is 100% on RA, normal by my interpretation.    COORDINATION OF CARE: 3:49 PM Discussed treatment plan with pt which includes lab work and pt agreed to plan.  Labs Review Labs Reviewed - No data to display  Imaging Review No results found.   EKG Interpretation None      MDM   Final diagnoses:  Cellulitis of left lower extremity  Skin rash    Patient here for evaluation of recurrent rash. Clinical exam is consistent with a fungal skin infection with overlying cellulitis. Treating for cellulitis with Keflex and Bactrim. Treating for fungal skin infection with fluconazole topical Lotrimin. The patient's importance of PCP and specialist follow-up for further evaluation. Return precautions discussed. There is no evidence of abscess or necrotizing skin infection. Labs obtained given patient's history of general malaise.  I personally performed the services described in this documentation, which was scribed in my presence. The recorded information has been reviewed and is accurate.    Tilden FossaElizabeth Terricka Onofrio, MD 01/25/14 [id number]

## 2014-01-25 NOTE — Discharge Instructions (Signed)

## 2014-01-25 NOTE — ED Notes (Signed)
Patient states that she had a rash that was treated by her PCP with bacteria and fungal abx. States the fungal infection has cleared up but the rash has continued to spread. Has had cellulitis in the past

## 2015-05-08 IMAGING — CR DG ABDOMEN ACUTE W/ 1V CHEST
4 series · 4 of 4 positions shown · non-contrast
Comparison: None.

CLINICAL DATA: Vomiting and diarrhea.

EXAM:
ACUTE ABDOMEN SERIES (ABDOMEN 2 VIEW & CHEST 1 VIEW)

[w chest pa]
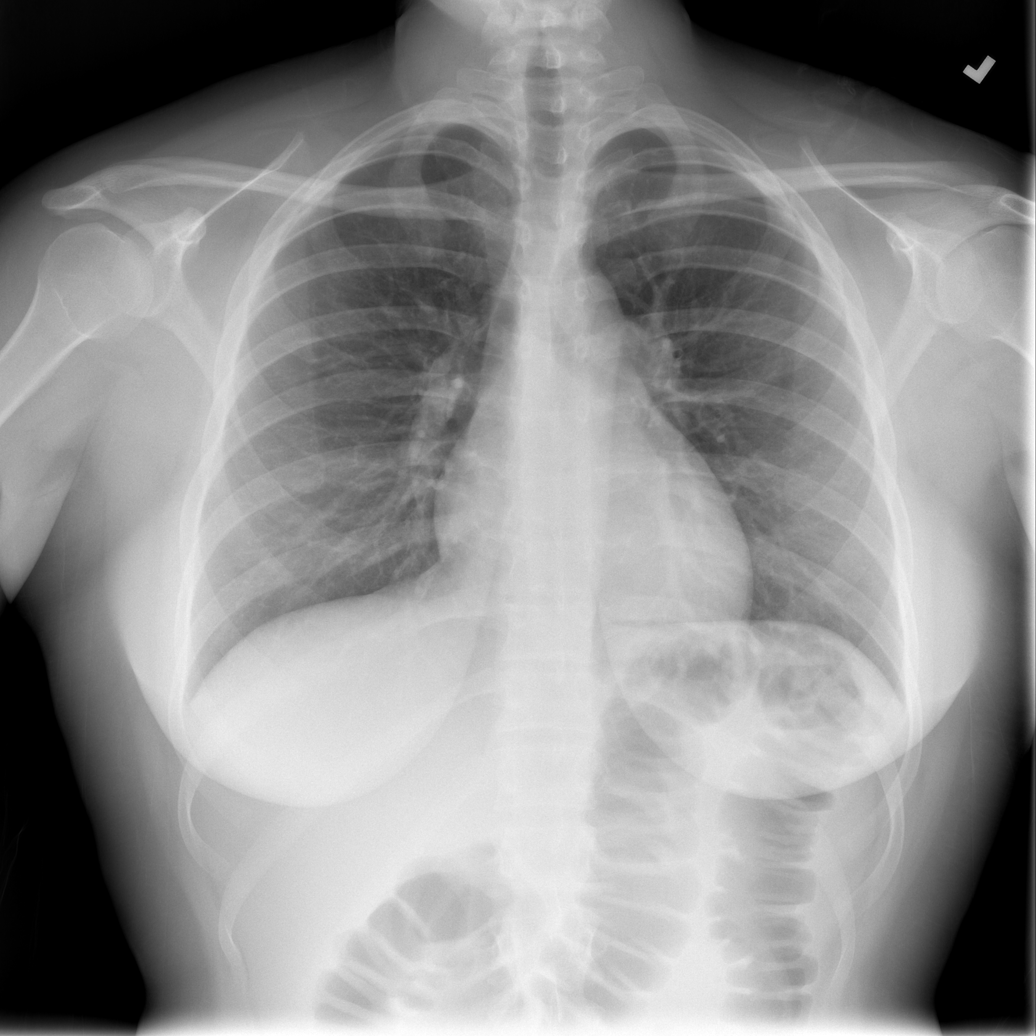

[w abdomen upright]
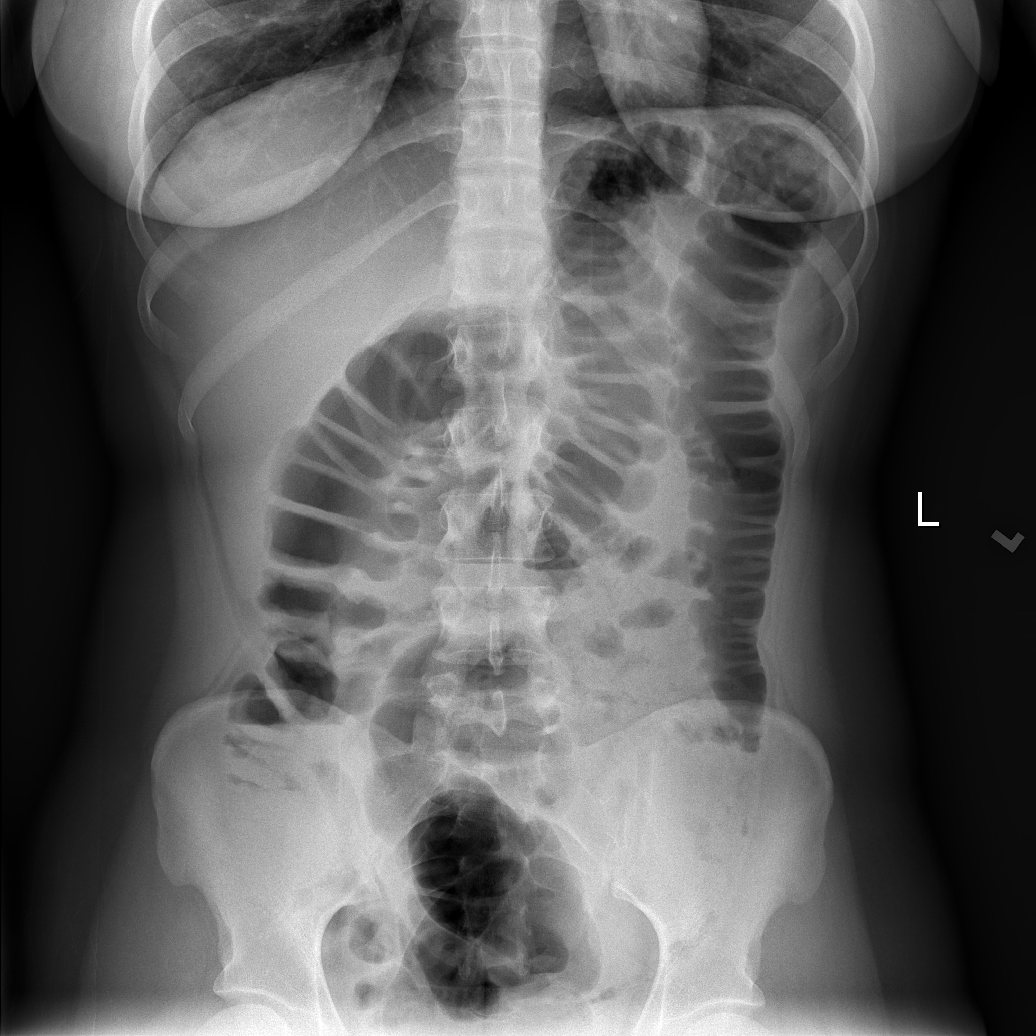

[t abdomen supine (1 of 2)]
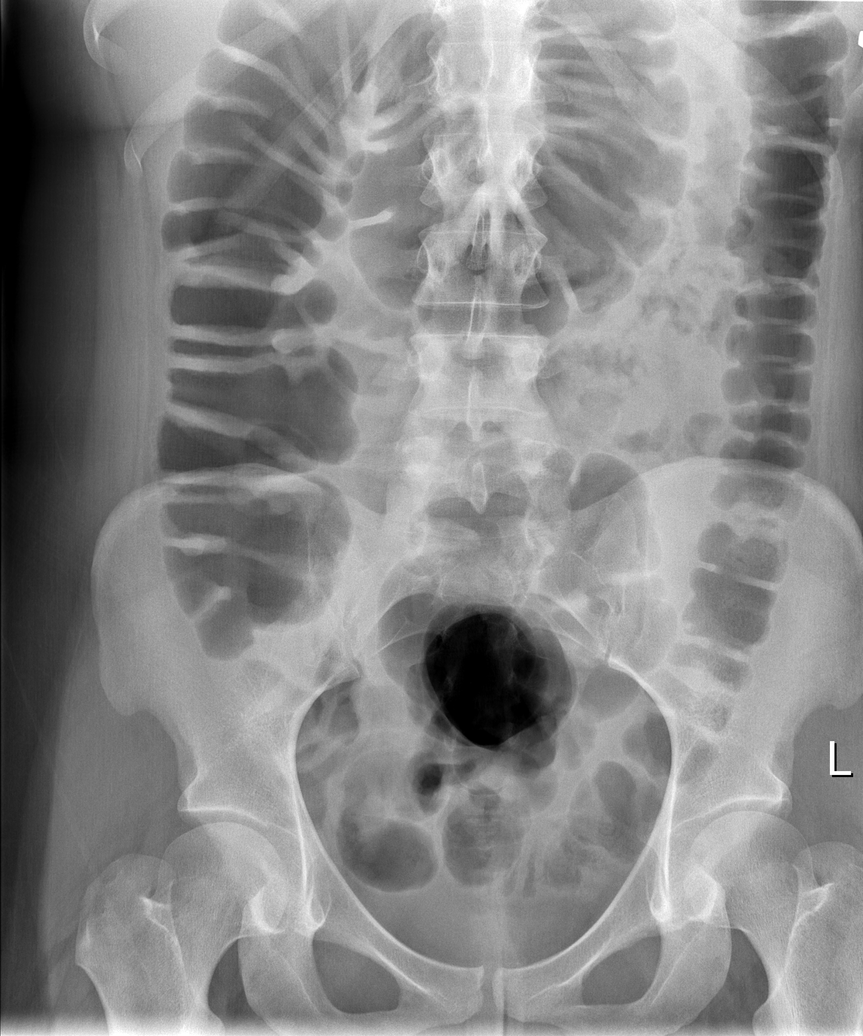

[t abdomen supine (2 of 2)]
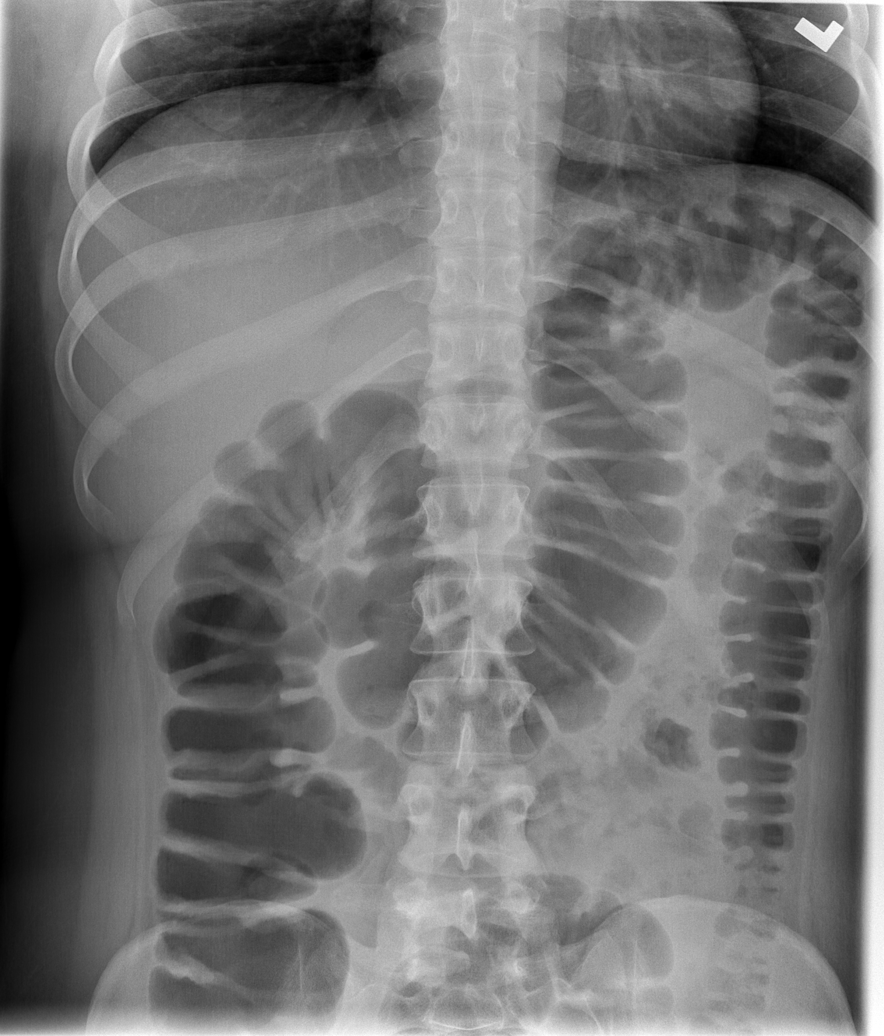

[4 of 4 positions shown; findings below may reference images not displayed]

FINDINGS: The heart size and mediastinal contours are normal. The lungs are
clear. There is no pleural effusion or pneumothorax. No acute
osseous findings are identified.

There is prominent gas throughout the colon without significant
associated distension or wall thickening. There is no pneumatosis.
No significant small bowel distention is present. There are
scattered air-fluid levels on the erect examination, primarily in
the colon. No free intraperitoneal air or suspicious calcification
identified. The osseous structures appear normal.
IMPRESSION: Prominent gas throughout the colon with scattered air-fluid levels,
nonspecific and likely related to diarrhea. No evidence of bowel
obstruction or free air.
